# Patient Record
Sex: Female | Born: 1985 | Race: White | Hispanic: No | Smoking: Current every day smoker
Health system: Southern US, Community
[De-identification: ages and names within clinical notes are randomized; demographics above are authoritative.]

## PROBLEM LIST (undated history)

## (undated) DIAGNOSIS — R87629 Unspecified abnormal cytological findings in specimens from vagina: Secondary | ICD-10-CM

## (undated) HISTORY — PX: CRYOTHERAPY: SHX1416

## (undated) HISTORY — PX: WISDOM TOOTH EXTRACTION: SHX21

## (undated) HISTORY — DX: Unspecified abnormal cytological findings in specimens from vagina: R87.629

## (undated) HISTORY — PX: APPENDECTOMY: SHX54

---
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
## 2003-02-18 ENCOUNTER — Emergency Department (HOSPITAL_COMMUNITY): Admission: EM | Admit: 2003-02-18 | Discharge: 2003-02-18 | Payer: Self-pay | Admitting: Emergency Medicine

## 2003-04-28 ENCOUNTER — Emergency Department (HOSPITAL_COMMUNITY): Admission: EM | Admit: 2003-04-28 | Discharge: 2003-04-28 | Payer: Self-pay

## 2004-01-11 ENCOUNTER — Ambulatory Visit (HOSPITAL_COMMUNITY): Admission: RE | Admit: 2004-01-11 | Discharge: 2004-01-11 | Payer: Self-pay | Admitting: Obstetrics & Gynecology

## 2004-01-14 ENCOUNTER — Emergency Department (HOSPITAL_COMMUNITY): Admission: EM | Admit: 2004-01-14 | Discharge: 2004-01-14 | Payer: Self-pay | Admitting: Emergency Medicine

## 2005-10-08 ENCOUNTER — Emergency Department (HOSPITAL_COMMUNITY): Admission: EM | Admit: 2005-10-08 | Discharge: 2005-10-08 | Payer: Self-pay | Admitting: Emergency Medicine

## 2006-12-23 ENCOUNTER — Emergency Department (HOSPITAL_COMMUNITY): Admission: EM | Admit: 2006-12-23 | Discharge: 2006-12-23 | Payer: Self-pay | Admitting: Emergency Medicine

## 2008-04-27 ENCOUNTER — Inpatient Hospital Stay (HOSPITAL_COMMUNITY): Admission: AD | Admit: 2008-04-27 | Discharge: 2008-04-29 | Payer: Self-pay | Admitting: Obstetrics & Gynecology

## 2009-03-20 ENCOUNTER — Emergency Department (HOSPITAL_COMMUNITY): Admission: EM | Admit: 2009-03-20 | Discharge: 2009-03-20 | Payer: Self-pay | Admitting: Emergency Medicine

## 2009-09-17 ENCOUNTER — Emergency Department (HOSPITAL_COMMUNITY): Admission: EM | Admit: 2009-09-17 | Discharge: 2009-09-17 | Payer: Self-pay | Admitting: Emergency Medicine

## 2010-05-27 LAB — DIFFERENTIAL
Eosinophils Relative: 0 % (ref 0–5)
Lymphocytes Relative: 4 % — ABNORMAL LOW (ref 12–46)
Lymphs Abs: 0.6 10*3/uL — ABNORMAL LOW (ref 0.7–4.0)
Monocytes Absolute: 0.2 10*3/uL (ref 0.1–1.0)
Neutro Abs: 13 10*3/uL — ABNORMAL HIGH (ref 1.7–7.7)
Neutrophils Relative %: 94 % — ABNORMAL HIGH (ref 43–77)

## 2010-05-27 LAB — URINE MICROSCOPIC-ADD ON

## 2010-05-27 LAB — COMPREHENSIVE METABOLIC PANEL
Alkaline Phosphatase: 120 U/L — ABNORMAL HIGH (ref 39–117)
BUN: 14 mg/dL (ref 6–23)
Chloride: 110 mEq/L (ref 96–112)
GFR calc Af Amer: >60 mL/min (ref >60)
GFR calc non Af Amer: >60 mL/min (ref >60)
Potassium: 4.6 mEq/L (ref 3.5–5.1)

## 2010-05-27 LAB — URINALYSIS, ROUTINE W REFLEX MICROSCOPIC
Ketones, ur: >80 mg/dL — AB
Protein, ur: NEGATIVE mg/dL
Urobilinogen, UA: 0.2 mg/dL (ref 0.0–1.0)

## 2010-05-27 LAB — CBC
HCT: 48.2 % — ABNORMAL HIGH (ref 36.0–46.0)
Hemoglobin: 16.3 g/dL — ABNORMAL HIGH (ref 12.0–15.0)
MCHC: 33.8 g/dL (ref 30.0–36.0)
Platelets: 205 10*3/uL (ref 150–400)
RDW: 12.7 % (ref 11.5–15.5)

## 2010-06-26 LAB — CBC
HCT: 31.9 % — ABNORMAL LOW (ref 36.0–46.0)
HCT: 38.2 % (ref 36.0–46.0)
Hemoglobin: 10.9 g/dL — ABNORMAL LOW (ref 12.0–15.0)
MCHC: 34.2 g/dL (ref 30.0–36.0)
MCV: 94.6 fL (ref 78.0–100.0)
MCV: 95.3 fL (ref 78.0–100.0)
Platelets: 120 10*3/uL — ABNORMAL LOW (ref 150–400)
RDW: 13.4 % (ref 11.5–15.5)
RDW: 13.5 % (ref 11.5–15.5)
WBC: 20 10*3/uL — ABNORMAL HIGH (ref 4.0–10.5)

## 2010-07-13 ENCOUNTER — Emergency Department (HOSPITAL_COMMUNITY): Payer: Medicaid Other

## 2010-07-13 ENCOUNTER — Emergency Department (HOSPITAL_COMMUNITY)
Admission: EM | Admit: 2010-07-13 | Discharge: 2010-07-13 | Disposition: A | Discharge status: Home / Self Care | Payer: Medicaid Other | Attending: Emergency Medicine | Admitting: Emergency Medicine

## 2010-07-13 DIAGNOSIS — W268XXA Contact with other sharp object(s), not elsewhere classified, initial encounter: Secondary | ICD-10-CM | POA: Insufficient documentation

## 2010-07-13 DIAGNOSIS — S9780XA Crushing injury of unspecified foot, initial encounter: Secondary | ICD-10-CM | POA: Insufficient documentation

## 2010-07-13 DIAGNOSIS — S91309A Unspecified open wound, unspecified foot, initial encounter: Secondary | ICD-10-CM | POA: Insufficient documentation

## 2010-07-13 DIAGNOSIS — W64XXXA Exposure to other animate mechanical forces, initial encounter: Secondary | ICD-10-CM | POA: Insufficient documentation

## 2010-07-27 NOTE — Op Note (Signed)
[place]  Patient:    Tracy Barrera, Tracy Barrera                       MRN: [id number] Proc. Date: 08/03/99 Adm. Date:  [id number] Disc. Date: [id number] Attending:  Katha Cabal                           Operative Report  PREOPERATIVE DIAGNOSIS:  POSTOPERATIVE DIAGNOSIS:  OPERATION PERFORMED:  SURGEON:  Molli Hazard B. Daphine Deutscher, M.D.  OPERATIVE FINDINGS:  Early acute retrocecal appendicitis.  ANESTHESIA:  General endotracheal.  INDICATIONS FOR PROCEDURE:  The patient is a 26 year old young lady with a 24-hour history of right lower quadrant abdominal pain.  According to her mother, she has had a pain like this in the distant past.  CT scan with rectal contrast was obtained in the emergency room which was read by Arnell Sieving, M.D. as demonstrating retrocecal appendix with early appendicitis. Informed consent was obtained from the mother and daughter in the holding area and options were fully discussed including observation with antibiotics as well as open surgery.  They were aware of the risks of the procedure and indications.  DESCRIPTION OF PROCEDURE:  The patient was taken to operating room 16 and given general anesthesia.  The abdomen was prepped with Betadine and draped sterilely.  Preoperatively, she had received three grams of Unasyn.  I made a small transverse incision lateral to McBurneys point and used a muscle splitting approach and tried to minimize the size of the incision.  The abdomen was entered without difficulty.  I palpated into her pelvis and did not feel any inflammatory masses around her ovary.  The terminal ileum was seen and the appendix was found by following the tinea and this was very stuck down into the pelvis and was quite long.  I ended up finding the tip and then working my way backward using clips as well as ties to ligate the mesentery and finally isolate the base.  I double tied the base with #1 chromic and  then amputated it at the base and then cauterized the mucosa.  The appendix was removed thusly.  The area was irrigated.  No bleeding was seen.  Then the peritoneum was closed with 2-0 Vicryl.  The fascia was closed with a running 2-0 Vicryl and the wound was closed with 4-0 Vicryl.  Prior to closure, I injected the area with 0.5% Marcaine 10 cc and closed the skin with 4-0 Vicryl subcuticularly with benzoin and Steri-Strips.  The patient seemed to tolerate the procedure well and was taken to the recovery room in satisfactory condition. DD:  08/03/99 TD:  08/08/99 Job: [id number]

## 2016-06-19 ENCOUNTER — Other Ambulatory Visit: Payer: Self-pay | Admitting: Obstetrics & Gynecology

## 2016-06-20 LAB — CYTOLOGY - PAP

## 2016-06-29 ENCOUNTER — Encounter (HOSPITAL_COMMUNITY): Payer: Self-pay | Admitting: *Deleted

## 2016-06-29 ENCOUNTER — Emergency Department (HOSPITAL_COMMUNITY): Payer: Medicaid Other

## 2016-06-29 ENCOUNTER — Emergency Department (HOSPITAL_COMMUNITY)
Admission: EM | Admit: 2016-06-29 | Discharge: 2016-06-30 | Disposition: A | Discharge status: Home / Self Care | Payer: Medicaid Other | Attending: Emergency Medicine | Admitting: Emergency Medicine

## 2016-06-29 DIAGNOSIS — S022XXA Fracture of nasal bones, initial encounter for closed fracture: Secondary | ICD-10-CM | POA: Diagnosis not present

## 2016-06-29 DIAGNOSIS — F17200 Nicotine dependence, unspecified, uncomplicated: Secondary | ICD-10-CM | POA: Diagnosis not present

## 2016-06-29 DIAGNOSIS — S20212A Contusion of left front wall of thorax, initial encounter: Secondary | ICD-10-CM | POA: Insufficient documentation

## 2016-06-29 DIAGNOSIS — Y999 Unspecified external cause status: Secondary | ICD-10-CM | POA: Insufficient documentation

## 2016-06-29 DIAGNOSIS — S0990XA Unspecified injury of head, initial encounter: Secondary | ICD-10-CM | POA: Diagnosis present

## 2016-06-29 DIAGNOSIS — R935 Abnormal findings on diagnostic imaging of other abdominal regions, including retroperitoneum: Secondary | ICD-10-CM | POA: Insufficient documentation

## 2016-06-29 DIAGNOSIS — W5512XA Struck by horse, initial encounter: Secondary | ICD-10-CM | POA: Insufficient documentation

## 2016-06-29 DIAGNOSIS — Y939 Activity, unspecified: Secondary | ICD-10-CM | POA: Diagnosis not present

## 2016-06-29 DIAGNOSIS — Y929 Unspecified place or not applicable: Secondary | ICD-10-CM | POA: Insufficient documentation

## 2016-06-29 DIAGNOSIS — M25512 Pain in left shoulder: Secondary | ICD-10-CM | POA: Insufficient documentation

## 2016-06-29 DIAGNOSIS — R079 Chest pain, unspecified: Secondary | ICD-10-CM | POA: Diagnosis not present

## 2016-06-29 DIAGNOSIS — M542 Cervicalgia: Secondary | ICD-10-CM | POA: Insufficient documentation

## 2016-06-29 LAB — COMPREHENSIVE METABOLIC PANEL
ALBUMIN: 4.1 g/dL (ref 3.5–5.0)
ALT: 9 U/L — AB (ref 14–54)
AST: 20 U/L (ref 15–41)
Alkaline Phosphatase: 75 U/L (ref 38–126)
Anion gap: 10 (ref 5–15)
BILIRUBIN TOTAL: 0.4 mg/dL (ref 0.3–1.2)
BUN: 6 mg/dL (ref 6–20)
CHLORIDE: 111 mmol/L (ref 101–111)
CO2: 18 mmol/L — ABNORMAL LOW (ref 22–32)
CREATININE: 0.85 mg/dL (ref 0.44–1.00)
Calcium: 9.3 mg/dL (ref 8.9–10.3)
GFR calc Af Amer: >60 mL/min (ref >60)
GLUCOSE: 97 mg/dL (ref 65–99)
Potassium: 3.3 mmol/L — ABNORMAL LOW (ref 3.5–5.1)
Sodium: 139 mmol/L (ref 135–145)
Total Protein: 6.6 g/dL (ref 6.5–8.1)

## 2016-06-29 LAB — CBC
HEMATOCRIT: 39.8 % (ref 36.0–46.0)
Hemoglobin: 13.9 g/dL (ref 12.0–15.0)
MCH: 31.4 pg (ref 26.0–34.0)
MCHC: 34.9 g/dL (ref 30.0–36.0)
MCV: 89.8 fL (ref 78.0–100.0)
PLATELETS: 223 10*3/uL (ref 150–400)
RBC: 4.43 MIL/uL (ref 3.87–5.11)
RDW: 12.2 % (ref 11.5–15.5)
WBC: 13.8 10*3/uL — AB (ref 4.0–10.5)

## 2016-06-29 LAB — I-STAT CHEM 8, ED
BUN: 7 mg/dL (ref 6–20)
Calcium, Ion: 1.01 mmol/L — ABNORMAL LOW (ref 1.15–1.40)
Chloride: 111 mmol/L (ref 101–111)
Creatinine, Ser: 0.7 mg/dL (ref 0.44–1.00)
Glucose, Bld: 98 mg/dL (ref 65–99)
HEMATOCRIT: 40 % (ref 36.0–46.0)
HEMOGLOBIN: 13.6 g/dL (ref 12.0–15.0)
POTASSIUM: 3.5 mmol/L (ref 3.5–5.1)
SODIUM: 140 mmol/L (ref 135–145)
TCO2: 21 mmol/L (ref 0–100)

## 2016-06-29 LAB — URINALYSIS, ROUTINE W REFLEX MICROSCOPIC
BACTERIA UA: NONE SEEN
Bilirubin Urine: NEGATIVE
GLUCOSE, UA: NEGATIVE mg/dL
KETONES UR: 5 mg/dL — AB
Leukocytes, UA: NEGATIVE
Nitrite: NEGATIVE
PROTEIN: NEGATIVE mg/dL
Specific Gravity, Urine: 1.003 — ABNORMAL LOW (ref 1.005–1.030)
pH: 8 (ref 5.0–8.0)

## 2016-06-29 LAB — ETHANOL: Alcohol, Ethyl (B): <5 mg/dL (ref <5)

## 2016-06-29 LAB — PROTIME-INR
INR: 1.04
Prothrombin Time: 13.6 seconds (ref 11.4–15.2)

## 2016-06-29 LAB — CDS SEROLOGY

## 2016-06-29 LAB — I-STAT CG4 LACTIC ACID, ED: LACTIC ACID, VENOUS: 1.8 mmol/L (ref 0.5–1.9)

## 2016-06-29 LAB — I-STAT BETA HCG BLOOD, ED (MC, WL, AP ONLY): I-stat hCG, quantitative: <5 m[IU]/mL (ref <5)

## 2016-06-29 MED ORDER — SODIUM CHLORIDE 0.9 % IV SOLN: INTRAVENOUS | Status: DC

## 2016-06-29 MED ORDER — SODIUM CHLORIDE 0.9 % IV BOLUS (SEPSIS)
1000.0000 mL | Freq: Once | INTRAVENOUS | Status: AC
  Administered 2016-06-29: 1000 mL via INTRAVENOUS

## 2016-06-29 MED ORDER — ACETAMINOPHEN 500 MG PO TABS: 1000.0000 mg | ORAL_TABLET | Freq: Once | ORAL | Status: DC

## 2016-06-29 MED ORDER — IOPAMIDOL (ISOVUE-300) INJECTION 61%
INTRAVENOUS | Status: AC
  Administered 2016-06-29: 100 mL
  Filled 2016-06-29: qty 100

## 2016-06-29 MED ORDER — FENTANYL CITRATE (PF) 100 MCG/2ML IJ SOLN
50.0000 ug | INTRAMUSCULAR | Status: DC | PRN
  Administered 2016-06-29: 50 ug via INTRAVENOUS
  Filled 2016-06-29: qty 2

## 2016-06-29 NOTE — Progress Notes (Signed)
   06/29/16 2100  Clinical Encounter Type  Visited With Patient;Health care provider  Visit Type Trauma  Referral From Nurse  Consult/Referral To Chaplain  Spiritual Encounters  Spiritual Needs Emotional  Stress Factors  Patient Stress Factors Health changes    Chaplain paged to level 2 trauma in ED. Patient stepped on by horse. Provided emotional support and ministry of presence. Rejeana Fadness L. Salomon Fick, MDiv

## 2016-06-29 NOTE — ED Provider Notes (Signed)
[facility]-EMERGENCY DEPT Provider Note   CSN: [id number] Arrival date & time: 06/29/16  2116     History   Chief Complaint Chief Complaint  Patient presents with  . Fall    HPI Tracy Barrera is a 31 y.o. female.  Patient presents by EMS after a mule dragged her several feet.  Patient has significant left upper chest and shoulder pain. Patient did have head injury with brief loss of consciousness. No blood thinners no significant medical history. Patient has significant pain and was given fentanyl on route currently does not want further medication. Pain with any range of motion.      History reviewed. No pertinent past medical history.  There are no active problems to display for this patient.   History reviewed. No pertinent surgical history.  OB History    No data available       Home Medications    Prior to Admission medications   Not on File    Family History No family history on file.  Social History Social History  Substance Use Topics  . Smoking status: Current Every Day Smoker  . Smokeless tobacco: Never Used  . Alcohol use No     Allergies   Aspirin   Review of Systems Review of Systems  Constitutional: Negative for chills and fever.  HENT: Negative for congestion.   Eyes: Negative for visual disturbance.  Respiratory: Negative for shortness of breath.   Cardiovascular: Negative for chest pain.  Gastrointestinal: Negative for abdominal pain and vomiting.  Genitourinary: Negative for dysuria and flank pain.  Musculoskeletal: Positive for arthralgias, back pain and neck pain. Negative for neck stiffness.  Skin: Negative for rash.  Neurological: Positive for syncope and headaches. Negative for light-headedness.     Physical Exam Updated Vital Signs BP 119/62   Pulse 64   Resp 18   Ht  (1.702 m)   Wt 160 lb (72.6 kg)   LMP 05/29/2016   SpO2 98%   BMI 25.06 kg/m   Physical Exam  Constitutional: She appears well-developed and  well-nourished. No distress.  HENT:  Head: Normocephalic.  C-collar in place  Eyes: EOM are normal. Right eye exhibits no discharge. Left eye exhibits no discharge.  Neck: Neck supple.  Cardiovascular: Tachycardia present.   Pulmonary/Chest: Effort normal. No respiratory distress.  Abdominal: Soft. There is no tenderness.  Musculoskeletal: She exhibits edema, tenderness and deformity.  Patient has significant tenderness left upper chest with ecchymosis, superficial abrasions and mild swelling. Patient has tenderness left shoulder left arm difficult exam due to pain, neurovascular intact.  No midline cervical thoracic or lumbar tenderness. No tenderness or effusion of hips knees or ankles bilateral, elbows or wrists bilateral.   Neurological: She is alert. No cranial nerve deficit.  Skin: Skin is warm. Rash noted.  Psychiatric:  In pain, anxious  Nursing note and vitals reviewed.    ED Treatments / Results  Labs (all labs ordered are listed, but only abnormal results are displayed) Labs Reviewed  COMPREHENSIVE METABOLIC PANEL - Abnormal; Notable for the following:       Result Value   Potassium 3.3 (*)    CO2 18 (*)    ALT 9 (*)    All other components within normal limits  CBC - Abnormal; Notable for the following:    WBC 13.8 (*)    All other components within normal limits  URINALYSIS, ROUTINE W REFLEX MICROSCOPIC - Abnormal; Notable for the following:    Color, Urine STRAW (*)  Specific Gravity, Urine 1.003 (*)    Hgb urine dipstick SMALL (*)    Ketones, ur 5 (*)    Squamous Epithelial / LPF 0-5 (*)    All other components within normal limits  I-STAT CHEM 8, ED - Abnormal; Notable for the following:    Calcium, Ion 1.01 (*)    All other components within normal limits  CDS SEROLOGY  ETHANOL  PROTIME-INR  I-STAT CG4 LACTIC ACID, ED  I-STAT BETA HCG BLOOD, ED ([facility], [facility], [facility] ONLY)  SAMPLE TO BLOOD BANK    EKG  EKG Interpretation  Date/Time:  Saturday June 29 2016 21:38:08 EDT Ventricular Rate:  62 PR Interval:    QRS Duration: 102 QT Interval:  455 QTC Calculation: 463 R Axis:   44 Text Interpretation:  Sinus rhythm Confirmed by Jodi Mourning MD, Youa Deloney [phone]) on 06/29/2016 10:13:24 PM       Radiology Ct Head Wo Contrast  Result Date: 06/29/2016 CLINICAL DATA:  Kink and interactive by a [place].  Headache. EXAM: CT HEAD WITHOUT CONTRAST CT MAXILLOFACIAL WITHOUT CONTRAST CT CERVICAL SPINE WITHOUT CONTRAST TECHNIQUE: Multidetector CT imaging of the head, cervical spine, and maxillofacial structures were performed using the standard protocol without intravenous contrast. Multiplanar CT image reconstructions of the cervical spine and maxillofacial structures were also generated. COMPARISON:  None. FINDINGS: CT HEAD FINDINGS Brain: No evidence of acute infarction, hemorrhage, hydrocephalus, extra-axial collection or mass lesion/mass effect. Vascular: No hyperdense vessel or unexpected calcification. Skull: Normal. Negative for fracture or focal lesion. Other: None. CT MAXILLOFACIAL FINDINGS Osseous: Fracture of the tip of the nasal bone without depression. Tiny fracture of the tip of the anterior maxillary spine. The remainder of the facial bones are intact. Orbits: Negative. No traumatic or inflammatory finding. Sinuses: Mild right frontal, right ethmoid and right maxillary sinus mucosal thickening. Soft tissues: Unremarkable. CT CERVICAL SPINE FINDINGS Alignment: Normal. Skull base and vertebrae: No acute fracture. No primary bone lesion or focal pathologic process. Soft tissues and spinal canal: No prevertebral fluid or swelling. No visible canal hematoma. Disc levels:  Minimal anterior spur formation at the C3-4 level. Upper chest: Clear lung apices. Other: None. IMPRESSION: 1. Nondepressed fracture of the distal tip of the nasal bone. 2. Tiny fracture of the tip of the anterior maxillary spine. 3. Mild chronic right frontal, ethmoid and maxillary sinusitis. 4. No  skull fracture or intracranial hemorrhage. 5. No cervical spine fracture or subluxation. Electronically Signed   By: Beckie Salts M.D.   On: 06/29/2016 23:14   Ct Chest W Contrast  Result Date: 06/29/2016 CLINICAL DATA:  Initial evaluation for acute trauma, kicked in track by horse. Acute left shoulder pain and back pain. EXAM: CT CHEST, ABDOMEN, AND PELVIS WITH CONTRAST TECHNIQUE: Multidetector CT imaging of the chest, abdomen and pelvis was performed following the standard protocol during bolus administration of intravenous contrast. CONTRAST:  ISOVUE-300 IOPAMIDOL (ISOVUE-300) INJECTION 61% COMPARISON:  Prior pelvic radiograph from earlier same day. FINDINGS: CT CHEST FINDINGS Cardiovascular: Intrathoracic aorta of normal caliber and appearance without acute injury. Visualized great vessels within normal limits. Heart size normal. No pericardial effusion. Limited evaluation of the pulmonary arteries grossly unremarkable. Mediastinum/Nodes: Visualized thyroid is normal. No pathologically enlarged mediastinal, hilar, or axillary adenopathy. No mediastinal hematoma. Esophagus is normal. Lungs/Pleura: Tracheobronchial tree is widely patent. Mild subsegmental atelectasis seen dependently within the lower lobes bilaterally. Lungs are otherwise clear. No focal infiltrates. No evidence for pulmonary contusion. No pulmonary edema or pleural effusion. No pneumothorax. No worrisome pulmonary  nodule or mass. Musculoskeletal: Soft tissue contusion present anterior to the left shoulder (series 201, image 10). External soft tissues otherwise unremarkable. No acute fracture. No worrisome lytic or blastic osseous lesions. CT ABDOMEN PELVIS FINDINGS Hepatobiliary: Liver demonstrates a normal contrast enhanced appearance. Gallbladder normal. No biliary dilatation. Pancreas: Pancreas within normal limits. Spleen: Spleen within normal limits. Adrenals/Urinary Tract: Adrenal glands are normal. Kidneys equal in size with  symmetric enhancement. No nephrolithiasis, hydronephrosis or focal enhancing renal mass. No hydroureter. Bladder within normal limits. Stomach/Bowel: Stomach within normal limits. No evidence for bowel obstruction or acute bowel injury. Appendix surgically absent. No acute inflammatory changes seen about the bowels. Vascular/Lymphatic: Normal intravascular enhancement seen throughout the intra-abdominal aorta and its branch vessels. No adenopathy. Reproductive: Uterus and ovaries within normal limits for age. Other: No free air. Trace free fluid within the pelvis, suspected to be physiologic in nature. No mesenteric or retroperitoneal hematoma. Small fat containing paraumbilical hernia noted. Musculoskeletal: External soft tissues demonstrate no acute abnormality. No acute osseus abnormality. No worrisome lytic or blastic osseous lesions. IMPRESSION: 1. Soft tissue contusion anterior to the left shoulder as above. 2. No other acute traumatic injury within the chest, abdomen, and pelvis. Electronically Signed   By: Rise Mu M.D.   On: 06/29/2016 23:36   Ct Cervical Spine Wo Contrast  Result Date: 06/29/2016 CLINICAL DATA:  Kink and interactive by a [place].  Headache. EXAM: CT HEAD WITHOUT CONTRAST CT MAXILLOFACIAL WITHOUT CONTRAST CT CERVICAL SPINE WITHOUT CONTRAST TECHNIQUE: Multidetector CT imaging of the head, cervical spine, and maxillofacial structures were performed using the standard protocol without intravenous contrast. Multiplanar CT image reconstructions of the cervical spine and maxillofacial structures were also generated. COMPARISON:  None. FINDINGS: CT HEAD FINDINGS Brain: No evidence of acute infarction, hemorrhage, hydrocephalus, extra-axial collection or mass lesion/mass effect. Vascular: No hyperdense vessel or unexpected calcification. Skull: Normal. Negative for fracture or focal lesion. Other: None. CT MAXILLOFACIAL FINDINGS Osseous: Fracture of the tip of the nasal bone without  depression. Tiny fracture of the tip of the anterior maxillary spine. The remainder of the facial bones are intact. Orbits: Negative. No traumatic or inflammatory finding. Sinuses: Mild right frontal, right ethmoid and right maxillary sinus mucosal thickening. Soft tissues: Unremarkable. CT CERVICAL SPINE FINDINGS Alignment: Normal. Skull base and vertebrae: No acute fracture. No primary bone lesion or focal pathologic process. Soft tissues and spinal canal: No prevertebral fluid or swelling. No visible canal hematoma. Disc levels:  Minimal anterior spur formation at the C3-4 level. Upper chest: Clear lung apices. Other: None. IMPRESSION: 1. Nondepressed fracture of the distal tip of the nasal bone. 2. Tiny fracture of the tip of the anterior maxillary spine. 3. Mild chronic right frontal, ethmoid and maxillary sinusitis. 4. No skull fracture or intracranial hemorrhage. 5. No cervical spine fracture or subluxation. Electronically Signed   By: Beckie Salts M.D.   On: 06/29/2016 23:14   Ct Abdomen Pelvis W Contrast  Result Date: 06/29/2016 CLINICAL DATA:  Initial evaluation for acute trauma, kicked in track by horse. Acute left shoulder pain and back pain. EXAM: CT CHEST, ABDOMEN, AND PELVIS WITH CONTRAST TECHNIQUE: Multidetector CT imaging of the chest, abdomen and pelvis was performed following the standard protocol during bolus administration of intravenous contrast. CONTRAST:  ISOVUE-300 IOPAMIDOL (ISOVUE-300) INJECTION 61% COMPARISON:  Prior pelvic radiograph from earlier same day. FINDINGS: CT CHEST FINDINGS Cardiovascular: Intrathoracic aorta of normal caliber and appearance without acute injury. Visualized great vessels within normal limits. Heart size normal. No  pericardial effusion. Limited evaluation of the pulmonary arteries grossly unremarkable. Mediastinum/Nodes: Visualized thyroid is normal. No pathologically enlarged mediastinal, hilar, or axillary adenopathy. No mediastinal hematoma.  Esophagus is normal. Lungs/Pleura: Tracheobronchial tree is widely patent. Mild subsegmental atelectasis seen dependently within the lower lobes bilaterally. Lungs are otherwise clear. No focal infiltrates. No evidence for pulmonary contusion. No pulmonary edema or pleural effusion. No pneumothorax. No worrisome pulmonary nodule or mass. Musculoskeletal: Soft tissue contusion present anterior to the left shoulder (series 201, image 10). External soft tissues otherwise unremarkable. No acute fracture. No worrisome lytic or blastic osseous lesions. CT ABDOMEN PELVIS FINDINGS Hepatobiliary: Liver demonstrates a normal contrast enhanced appearance. Gallbladder normal. No biliary dilatation. Pancreas: Pancreas within normal limits. Spleen: Spleen within normal limits. Adrenals/Urinary Tract: Adrenal glands are normal. Kidneys equal in size with symmetric enhancement. No nephrolithiasis, hydronephrosis or focal enhancing renal mass. No hydroureter. Bladder within normal limits. Stomach/Bowel: Stomach within normal limits. No evidence for bowel obstruction or acute bowel injury. Appendix surgically absent. No acute inflammatory changes seen about the bowels. Vascular/Lymphatic: Normal intravascular enhancement seen throughout the intra-abdominal aorta and its branch vessels. No adenopathy. Reproductive: Uterus and ovaries within normal limits for age. Other: No free air. Trace free fluid within the pelvis, suspected to be physiologic in nature. No mesenteric or retroperitoneal hematoma. Small fat containing paraumbilical hernia noted. Musculoskeletal: External soft tissues demonstrate no acute abnormality. No acute osseus abnormality. No worrisome lytic or blastic osseous lesions. IMPRESSION: 1. Soft tissue contusion anterior to the left shoulder as above. 2. No other acute traumatic injury within the chest, abdomen, and pelvis. Electronically Signed   By: Rise Mu M.D.   On: 06/29/2016 23:36   Dg Pelvis  Portable  Result Date: 06/29/2016 CLINICAL DATA:  Pain after being dragged by a mule EXAM: PORTABLE PELVIS 1-2 VIEWS COMPARISON:  None. FINDINGS: There is no evidence of pelvic fracture or diastasis. No pelvic bone lesions are seen. Surgical clips noted over the right pelvis. IMPRESSION: Negative. Electronically Signed   By: Kennith Center M.D.   On: 06/29/2016 22:00   Dg Chest Port 1 View  Result Date: 06/29/2016 CLINICAL DATA:  Chest abrasions and rib pain after being dragging by a [place]. EXAM: PORTABLE CHEST 1 VIEW COMPARISON:  None. FINDINGS: Normal sized heart. Clear lungs. No fracture or pneumothorax seen. No pleural fluid. IMPRESSION: Normal examination. Electronically Signed   By: Beckie Salts M.D.   On: 06/29/2016 21:59   Dg Shoulder Left Portable  Result Date: 06/29/2016 CLINICAL DATA:  Severe left shoulder pain after being dragged by a [place]. EXAM: LEFT SHOULDER - 1 VIEW COMPARISON:  None. FINDINGS: There is no evidence of fracture or dislocation. There is no evidence of arthropathy or other focal bone abnormality. Soft tissues are unremarkable. IMPRESSION: Normal examination. Electronically Signed   By: Beckie Salts M.D.   On: 06/29/2016 22:08   Ct Maxillofacial Wo Cm  Result Date: 06/29/2016 CLINICAL DATA:  Kink and interactive by a [place].  Headache. EXAM: CT HEAD WITHOUT CONTRAST CT MAXILLOFACIAL WITHOUT CONTRAST CT CERVICAL SPINE WITHOUT CONTRAST TECHNIQUE: Multidetector CT imaging of the head, cervical spine, and maxillofacial structures were performed using the standard protocol without intravenous contrast. Multiplanar CT image reconstructions of the cervical spine and maxillofacial structures were also generated. COMPARISON:  None. FINDINGS: CT HEAD FINDINGS Brain: No evidence of acute infarction, hemorrhage, hydrocephalus, extra-axial collection or mass lesion/mass effect. Vascular: No hyperdense vessel or unexpected calcification. Skull: Normal. Negative for fracture or focal lesion.  Other:  None. CT MAXILLOFACIAL FINDINGS Osseous: Fracture of the tip of the nasal bone without depression. Tiny fracture of the tip of the anterior maxillary spine. The remainder of the facial bones are intact. Orbits: Negative. No traumatic or inflammatory finding. Sinuses: Mild right frontal, right ethmoid and right maxillary sinus mucosal thickening. Soft tissues: Unremarkable. CT CERVICAL SPINE FINDINGS Alignment: Normal. Skull base and vertebrae: No acute fracture. No primary bone lesion or focal pathologic process. Soft tissues and spinal canal: No prevertebral fluid or swelling. No visible canal hematoma. Disc levels:  Minimal anterior spur formation at the C3-4 level. Upper chest: Clear lung apices. Other: None. IMPRESSION: 1. Nondepressed fracture of the distal tip of the nasal bone. 2. Tiny fracture of the tip of the anterior maxillary spine. 3. Mild chronic right frontal, ethmoid and maxillary sinusitis. 4. No skull fracture or intracranial hemorrhage. 5. No cervical spine fracture or subluxation. Electronically Signed   By: Beckie Salts M.D.   On: 06/29/2016 23:14    Procedures Procedures (including critical care time)  Medications Ordered in ED Medications  sodium chloride 0.9 % bolus 1,000 mL (1,000 mLs Intravenous New Bag/Given 06/29/16 2156)    And  0.9 %  sodium chloride infusion (not administered)  fentaNYL (SUBLIMAZE) injection 50 mcg (50 mcg Intravenous Given 06/29/16 2214)  acetaminophen (TYLENOL) tablet 1,000 mg (not administered)  iopamidol (ISOVUE-300) 61 % injection (100 mLs  Contrast Given 06/29/16 2237)     Initial Impression / Assessment and Plan / ED Course  I have reviewed the triage vital signs and the nursing notes.  Pertinent labs & imaging results that were available during my care of the patient were reviewed by me and considered in my medical decision making (see chart for details).    Patient presents after significant mechanism of injury with multiple areas of  tenderness and ecchymosis and abrasions. Plan for pain CT scans and multiple x-rays of injured areas.  Fortunately no significant injuries on CT scans. Patient ambulated to the bathroom. Plan for oral fluids and outpatient follow. Patient's mother here to assist.  Results and differential diagnosis were discussed with the patient/parent/guardian. Xrays were independently reviewed by myself.  Close follow up outpatient was discussed, comfortable with the plan.   Medications  sodium chloride 0.9 % bolus 1,000 mL (1,000 mLs Intravenous New Bag/Given 06/29/16 2156)    And  0.9 %  sodium chloride infusion (not administered)  fentaNYL (SUBLIMAZE) injection 50 mcg (50 mcg Intravenous Given 06/29/16 2214)  acetaminophen (TYLENOL) tablet 1,000 mg (not administered)  iopamidol (ISOVUE-300) 61 % injection (100 mLs  Contrast Given 06/29/16 2237)    Vitals:   06/29/16 2145 06/29/16 2147 06/29/16 2206 06/29/16 2257  BP: 111/64  115/67 119/62  Pulse: 70  64   Resp: 18  18   SpO2: 93%  98%   Weight:  160 lb (72.6 kg)    Height:  5\' 7"  (1.702 m)      Final diagnoses:  Struck by horse, initial encounter  Chest wall contusion, left, initial encounter  Acute head injury, initial encounter      Final Clinical Impressions(s) / ED Diagnoses   Final diagnoses:  Struck by horse, initial encounter  Chest wall contusion, left, initial encounter  Acute head injury, initial encounter    New Prescriptions New Prescriptions   No medications on file     Blane Ohara, MD 06/29/16 2353

## 2016-06-29 NOTE — ED Notes (Signed)
Port chest pelvis

## 2016-06-29 NOTE — ED Triage Notes (Signed)
The pt arrived by [facility]  The pt  Had a mule drag her several feet while she was putting the [place] up.Marland Kitchen  She was alos bitten on her  Lt shoulder  She is c/o lt shoulder headache  Iv per ems..  Alert oriented skin warm and dry.  She was given fentanyl  iv by ems  Pain 3-4 on her arrival  lmp last month all month scattered abrasions over her body

## 2016-06-29 NOTE — ED Notes (Signed)
To ct

## 2016-06-29 NOTE — Discharge Instructions (Signed)
Tylenol and ice for pain.   If you were given medicines take as directed.  If you are on coumadin or contraceptives realize their levels and effectiveness is altered by many different medicines.  If you have any reaction (rash, tongues swelling, other) to the medicines stop taking and see a physician.    If your blood pressure was elevated in the ER make sure you follow up for management with a primary doctor or return for chest pain, shortness of breath or stroke symptoms.  Please follow up as directed and return to the ER or see a physician for new or worsening symptoms.  Thank you. Vitals:   06/29/16 2145 06/29/16 2147 06/29/16 2206 06/29/16 2257  BP: 111/64  115/67 119/62  Pulse: 70  64   Resp: 18  18   SpO2: 93%  98%   Weight:  160 lb (72.6 kg)    Height:   (1.702 m)

## 2016-06-30 LAB — SAMPLE TO BLOOD BANK

## 2018-11-08 IMAGING — CR DG SHOULDER 1V*L*
2 series · 2 of 2 positions shown · non-contrast
Comparison: None.

CLINICAL DATA: Severe left shoulder pain after being dragged by [place]
Kendric.

EXAM:
LEFT SHOULDER - 1 VIEW

[AP (1 of 2)]
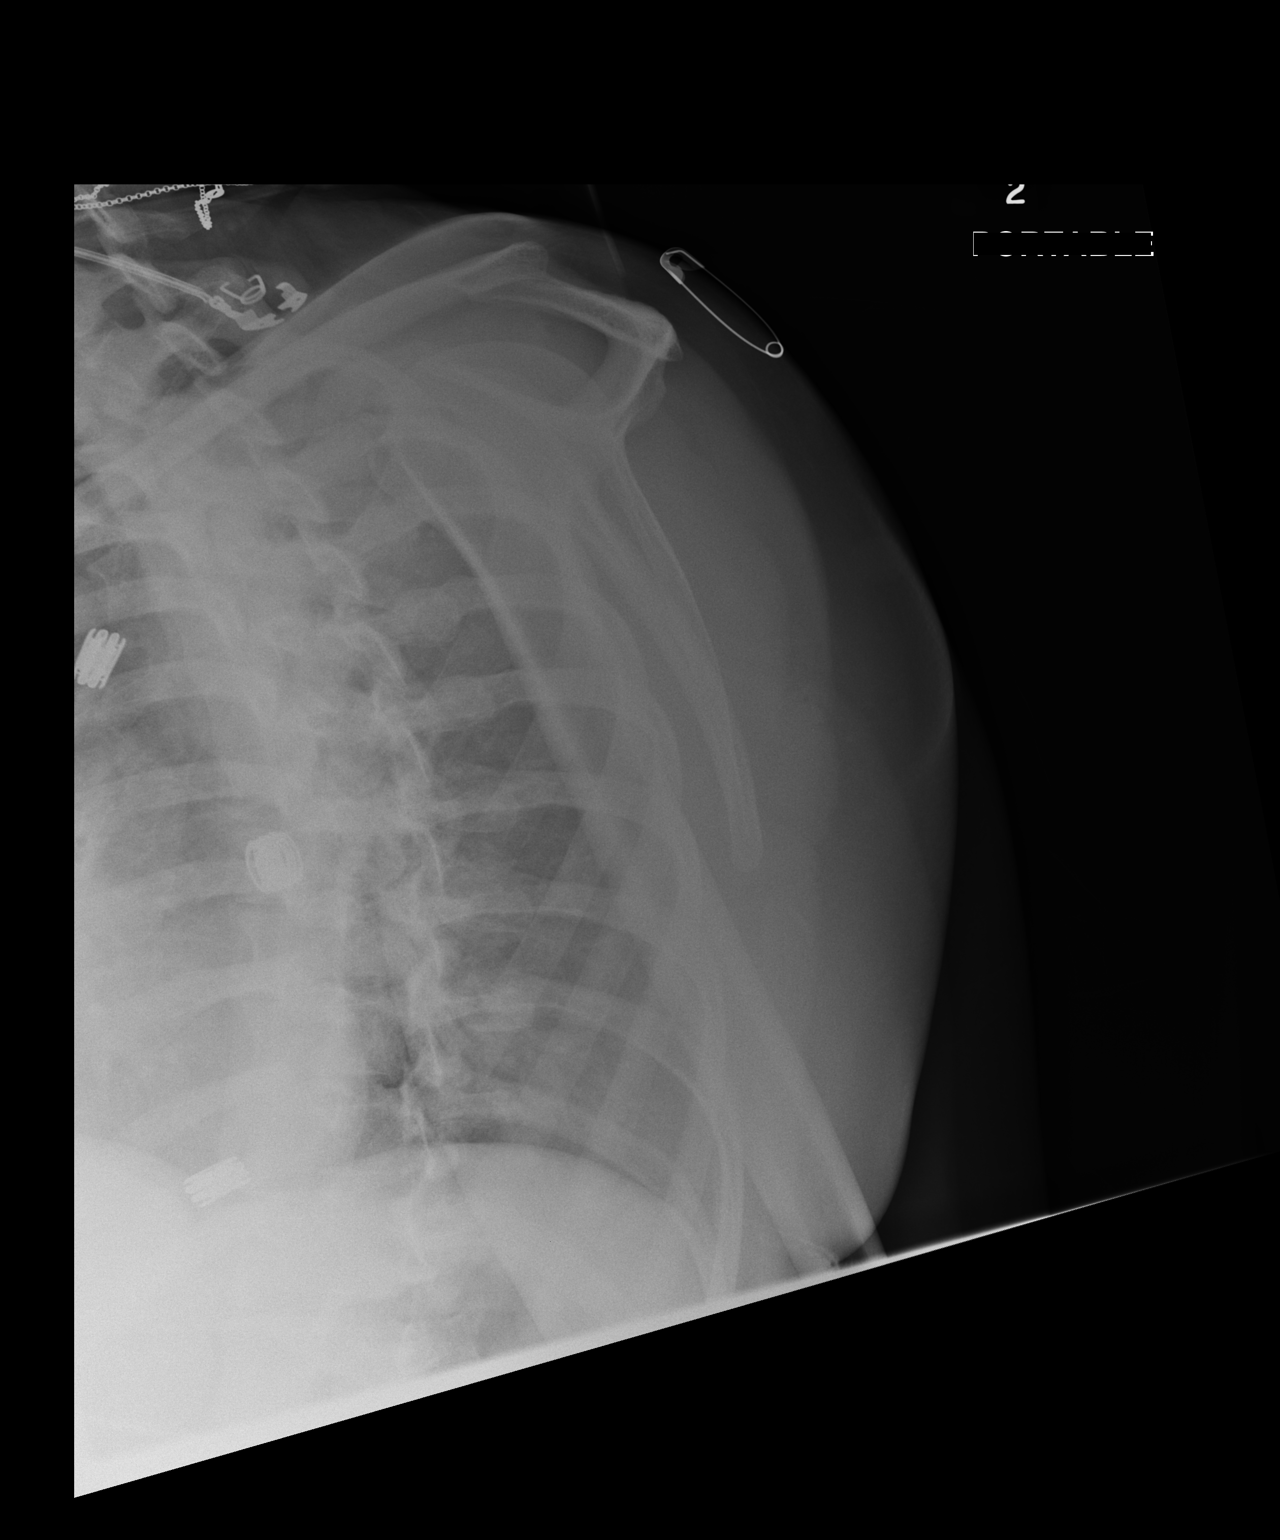

[AP (2 of 2)]
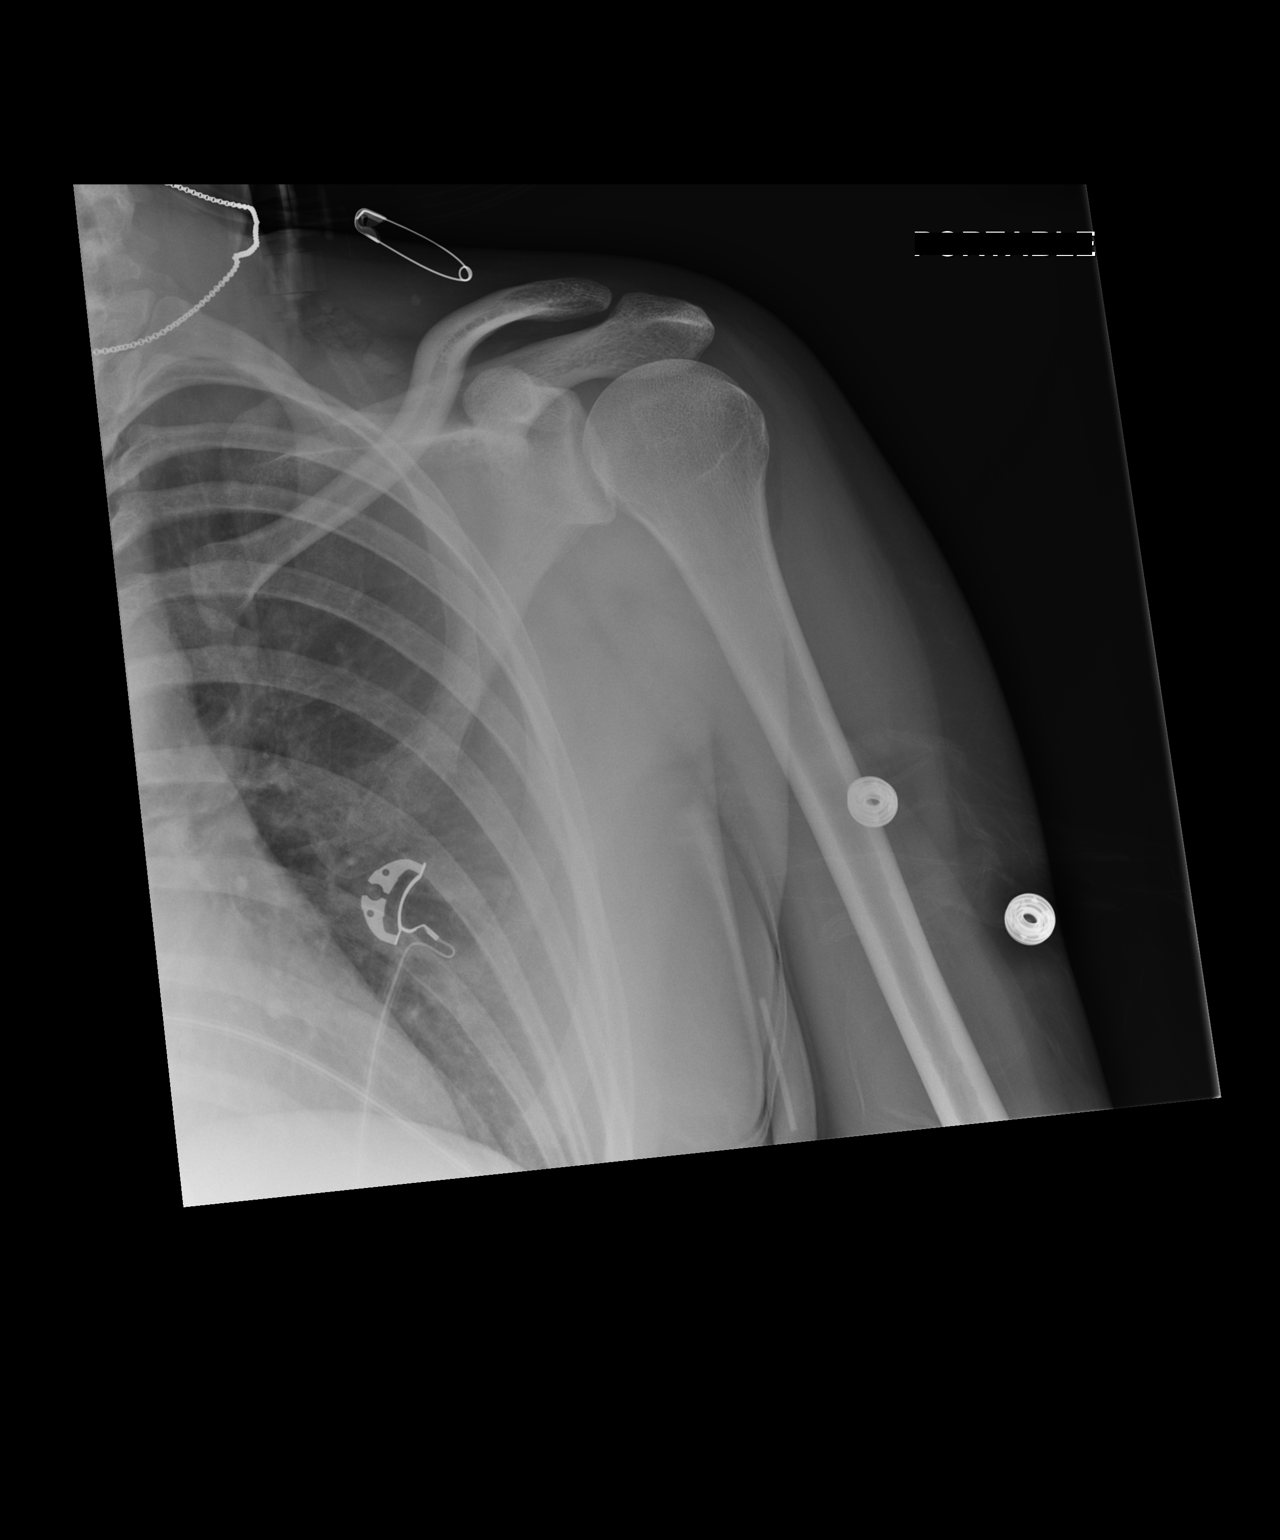

[2 of 2 positions shown; findings below may reference images not displayed]

FINDINGS: There is no evidence of fracture or dislocation. There is no
evidence of arthropathy or other focal bone abnormality. Soft
tissues are unremarkable.
IMPRESSION: Normal examination.

## 2018-11-08 IMAGING — CT CT CHEST W/ CM
1 of 5 series · 12 of 46 positions shown, 14 images · IV contrast (Iodine)
Comparison: Prior pelvic radiograph from earlier same day.

CLINICAL DATA: Initial evaluation for acute trauma, kicked in track
by horse. Acute left shoulder pain and back pain.

EXAM:
CT CHEST, ABDOMEN, AND PELVIS WITH CONTRAST
TECHNIQUE: Multidetector CT imaging of the chest, abdomen and pelvis was
performed following the standard protocol during bolus
administration of intravenous contrast.
CONTRAST:  100mL [id number] IOPAMIDOL ([id number]-[id number]) INJECTION 61%

[Series 201: cap with, idose (2) · axial · 0.98mm/px · z∈[-524,+6]mm · 12 of 126 slices shown, 14 images]
[im 10/126  soft-tissue]
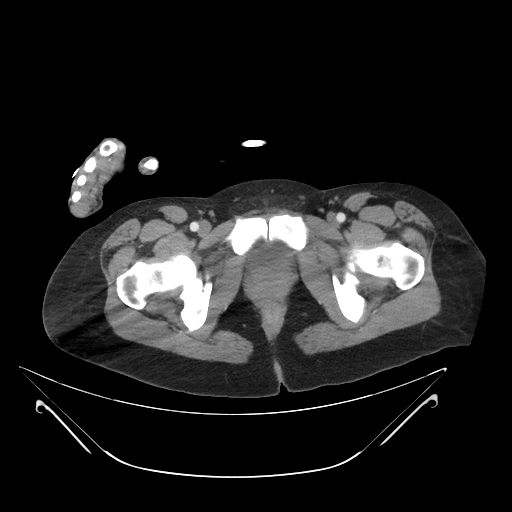
[im 10/126  bone]
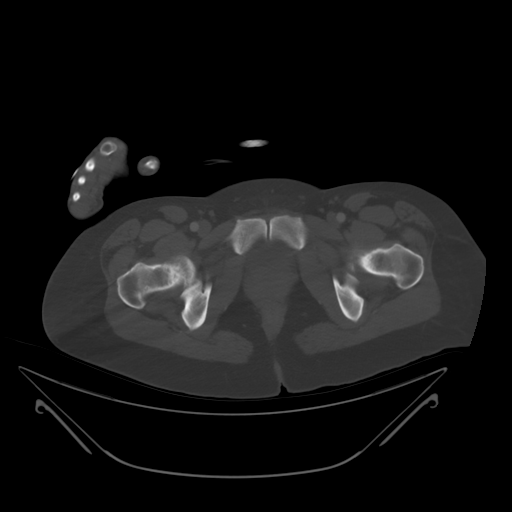
[im 20/126  soft-tissue]
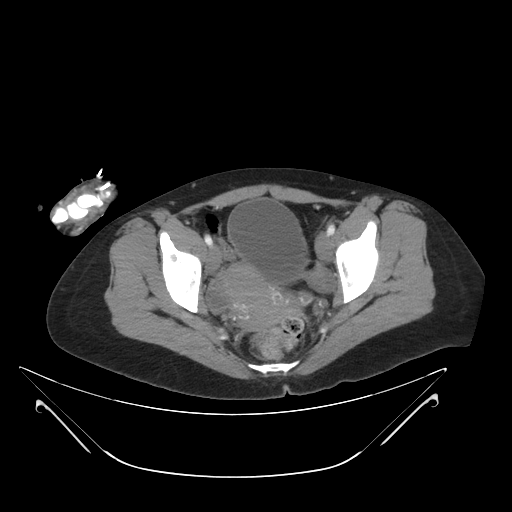
[im 29/126  soft-tissue]
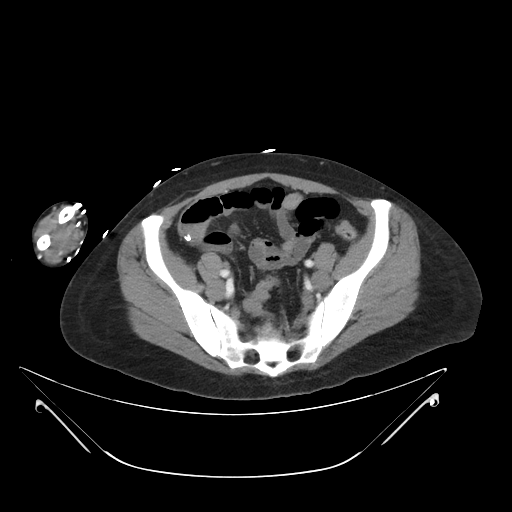
[im 39/126  soft-tissue]
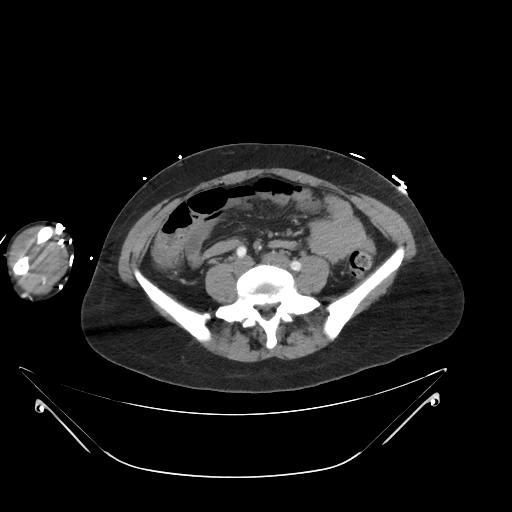
[im 49/126  soft-tissue]
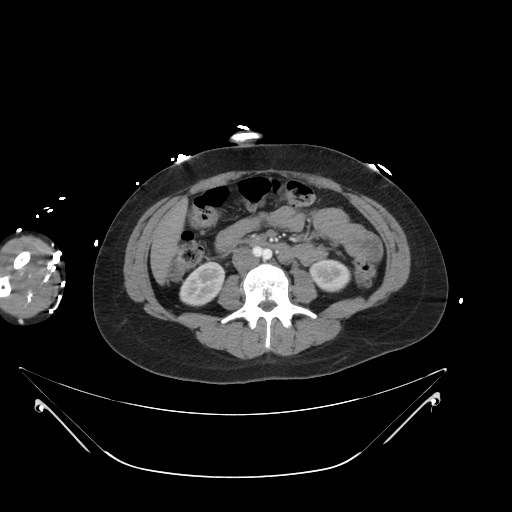
[im 58/126  soft-tissue]
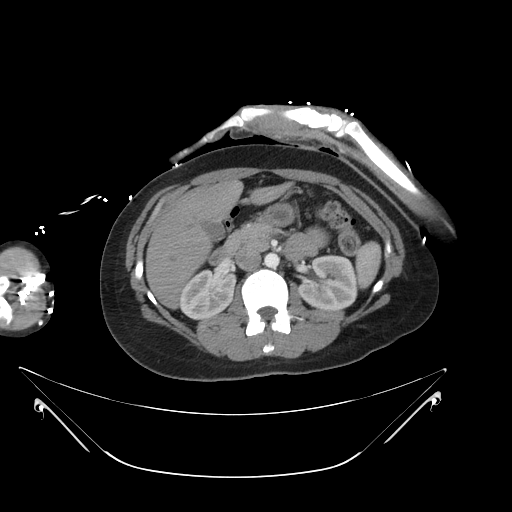
[im 68/126  soft-tissue]
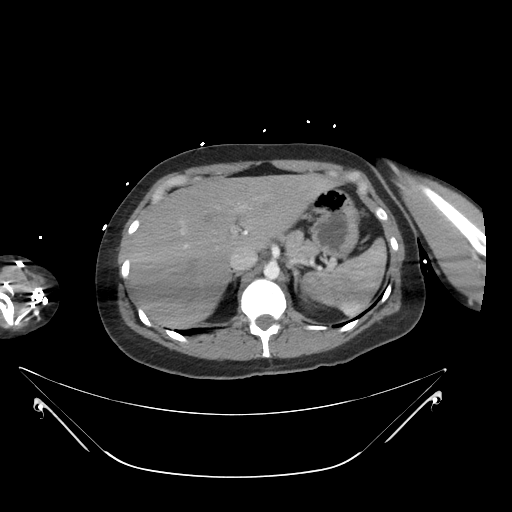
[im 77/126  soft-tissue]
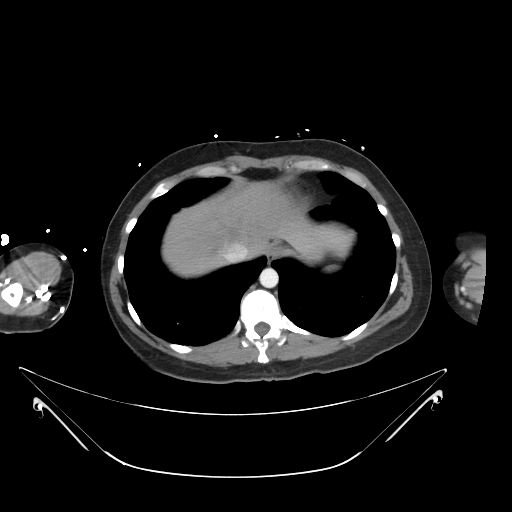
[im 87/126  soft-tissue]
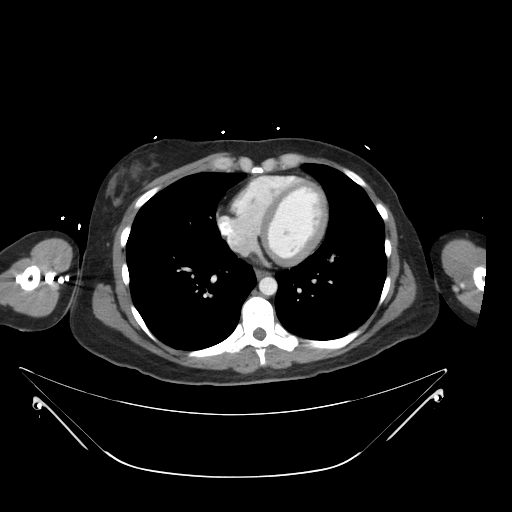
[im 87/126  bone]
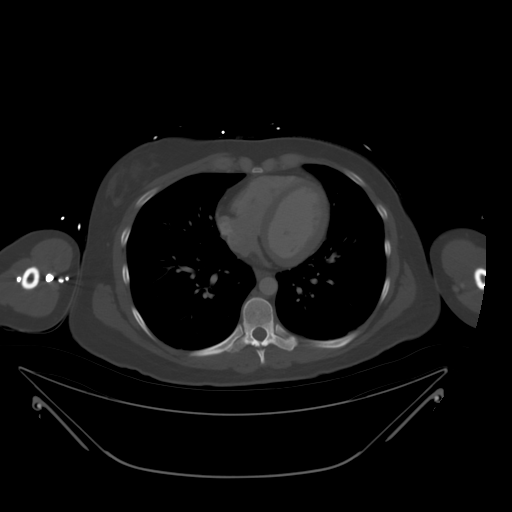
[im 97/126  soft-tissue]
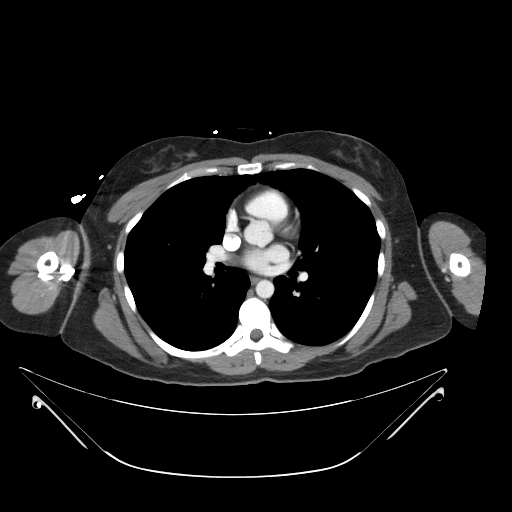
[im 106/126  soft-tissue]
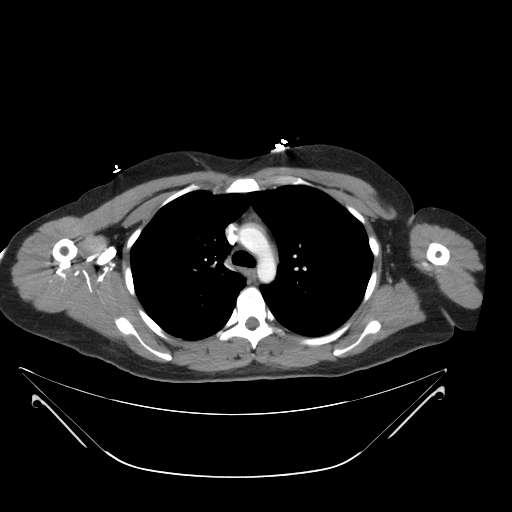
[im 116/126  soft-tissue]
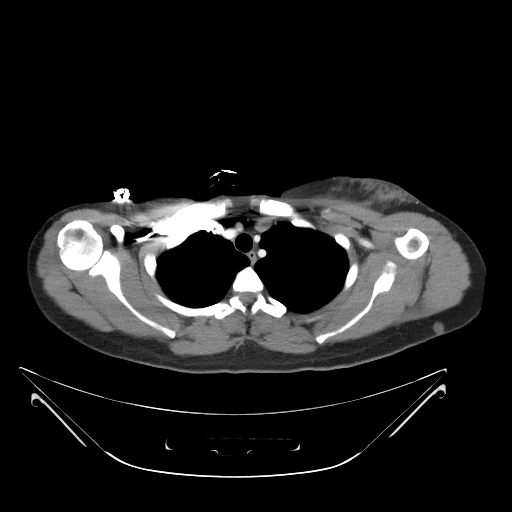

[12 of 46 positions shown; findings below may reference images not displayed]

FINDINGS: CT CHEST FINDINGS

Cardiovascular: Intrathoracic aorta of normal caliber and appearance
without acute injury. Visualized great vessels within normal limits.
Heart size normal. No pericardial effusion. Limited evaluation of
the pulmonary arteries grossly unremarkable.

Mediastinum/Nodes: Visualized thyroid is normal. No pathologically
enlarged mediastinal, hilar, or axillary adenopathy. No mediastinal
hematoma. Esophagus is normal.

Lungs/Pleura: Tracheobronchial tree is widely patent. Mild
subsegmental atelectasis seen dependently within the lower lobes
bilaterally. Lungs are otherwise clear. No focal infiltrates. No
evidence for pulmonary contusion. No pulmonary edema or pleural
effusion. No pneumothorax. No worrisome pulmonary nodule or mass.

Musculoskeletal: Soft tissue contusion present anterior to the left
shoulder (series 201, image 10). External soft tissues otherwise
unremarkable. No acute fracture. No worrisome lytic or blastic
osseous lesions.

CT ABDOMEN PELVIS FINDINGS

Hepatobiliary: Liver demonstrates a normal contrast enhanced
appearance. Gallbladder normal. No biliary dilatation.

Pancreas: Pancreas within normal limits.

Spleen: Spleen within normal limits.

Adrenals/Urinary Tract: Adrenal glands are normal. Kidneys equal in
size with symmetric enhancement. No nephrolithiasis, hydronephrosis
or focal enhancing renal mass. No hydroureter. Bladder within normal
limits.

Stomach/Bowel: Stomach within normal limits. No evidence for bowel
obstruction or acute bowel injury. Appendix surgically absent. No
acute inflammatory changes seen about the bowels.

Vascular/Lymphatic: Normal intravascular enhancement seen throughout
the intra-abdominal aorta and its branch vessels. No adenopathy.

Reproductive: Uterus and ovaries within normal limits for age.

Other: No free air. Trace free fluid within the pelvis, suspected to
be physiologic in nature. No mesenteric or retroperitoneal hematoma.
Small fat containing paraumbilical hernia noted.

Musculoskeletal: External soft tissues demonstrate no acute
abnormality. No acute osseus abnormality. No worrisome lytic or
blastic osseous lesions.
IMPRESSION: 1. Soft tissue contusion anterior to the left shoulder as above.
2. No other acute traumatic injury within the chest, abdomen, and
pelvis.

## 2020-01-12 ENCOUNTER — Ambulatory Visit (INDEPENDENT_AMBULATORY_CARE_PROVIDER_SITE_OTHER): Payer: Medicaid Other | Admitting: Obstetrics and Gynecology

## 2020-01-12 ENCOUNTER — Encounter: Payer: Self-pay | Admitting: Obstetrics and Gynecology

## 2020-01-12 ENCOUNTER — Other Ambulatory Visit (HOSPITAL_COMMUNITY)
Admission: RE | Admit: 2020-01-12 | Discharge: 2020-01-12 | Disposition: A | Discharge status: Home / Self Care | Payer: Medicaid Other | Source: Ambulatory Visit | Attending: Obstetrics and Gynecology | Admitting: Obstetrics and Gynecology

## 2020-01-12 ENCOUNTER — Other Ambulatory Visit: Payer: Self-pay

## 2020-01-12 VITALS — BP 119/71 | Ht 66.25 in | Wt 174.0 lb

## 2020-01-12 DIAGNOSIS — Z30432 Encounter for removal of intrauterine contraceptive device: Secondary | ICD-10-CM | POA: Diagnosis not present

## 2020-01-12 DIAGNOSIS — Z01419 Encounter for gynecological examination (general) (routine) without abnormal findings: Secondary | ICD-10-CM | POA: Insufficient documentation

## 2020-01-12 DIAGNOSIS — Z3009 Encounter for other general counseling and advice on contraception: Secondary | ICD-10-CM

## 2020-01-12 DIAGNOSIS — R102 Pelvic and perineal pain: Secondary | ICD-10-CM | POA: Insufficient documentation

## 2020-01-12 DIAGNOSIS — Z7189 Other specified counseling: Secondary | ICD-10-CM

## 2020-01-12 DIAGNOSIS — Z113 Encounter for screening for infections with a predominantly sexual mode of transmission: Secondary | ICD-10-CM

## 2020-01-12 NOTE — Progress Notes (Signed)
Patient states she has had an IUD for four years. Patient states that it give her pain for 1.5years.   Has tired pills, patch, nexplanon and depo provera. Patient states there were reasons each of these birth control methods didn't work for her.  Armandina Stammer RN

## 2020-01-13 ENCOUNTER — Encounter: Payer: Self-pay | Admitting: Obstetrics and Gynecology

## 2020-01-13 NOTE — Progress Notes (Signed)
   IUD Removal Procedure Note   Patient is 34 y.o. G2P2001 who is here for Paraguard IUD removal. She would like IUD removed secondary to pelvic pain. She has had issues with IUD. She understands that she could get pregnant after removal of IUD if she does not use another form of contraception. She has no other complaints today. Reviewed risks of removal including pain, bleeding, difficult removal and inability to remove IUD which may require hysteroscopic removal in OR. She affirms that she would like IUD removed.  BP 119/71   Ht 5' 6.25" (1.683 m)   Wt 174 lb (78.9 kg)   BMI 27.87 kg/m   Patient with normal appearing external female genitalia. Graves speculum placed in vagina and Paraguard IUD strings easily visualized. Strings grasped with ring forceps and removed easily. Minimal bleeding noted. All instruments removed from vagina. Patient tolerated procedure very well.    She was given post removal instructions. She is planning on using condoms for contraception.   Return for pre-op for BTL    Baldemar Lenis, M.D. Attending Center for [employer] [facility]wife)

## 2020-01-13 NOTE — Progress Notes (Signed)
GYNECOLOGY ANNUAL PREVENTATIVE CARE ENCOUNTER NOTE  Subjective:   Tracy Barrera is a 34 y.o. G44P2001 female here for a annual gynecologic exam. Current complaints: pain with IUD, would like it removed.  Causing issues with intercourse.  Denies abnormal vaginal bleeding, discharge,  or other gynecologic concerns. Accepts STI screen but doesn't want blood work.   Gynecologic History No LMP recorded. (Menstrual status: Other). Contraception: IUD Last Pap: 06/2016. Results: normal Last mammogram: n/a.  Obstetric History OB History  Gravida Para Term Preterm AB Living  2 2 2     1   SAB TAB Ectopic Multiple Live Births          1    # Outcome Date GA Lbr Len/2nd Weight Sex Delivery Anes PTL Lv  2 Term  [redacted]w[redacted]d   M Vag-Spont     1 Term             Past Medical History:  Diagnosis Date  . Vaginal Pap smear, abnormal     Past Surgical History:  Procedure Laterality Date  . APPENDECTOMY    . CRYOTHERAPY    . WISDOM TOOTH EXTRACTION      Current Outpatient Medications on File Prior to Visit  Medication Sig Dispense Refill  . levonorgestrel (MIRENA) 20 MCG/24HR IUD 1 each by Intrauterine route once.     No current facility-administered medications on file prior to visit.    Allergies  Allergen Reactions  . Aspirin Hives and Swelling    Social History   Socioeconomic History  . Marital status: Legally Separated    Spouse name: Not on file  . Number of children: 1  . Years of education: Not on file  . Highest education level: Some college, no degree  Occupational History  . Not on file  Tobacco Use  . Smoking status: Current Every Day Smoker    Packs/day: 1.00    Years: 22.00    Pack years: 22.00    Types: Cigarettes  . Smokeless tobacco: Never Used  Vaping Use  . Vaping Use: Never used  Substance and Sexual Activity  . Alcohol use: No  . Drug use: Yes    Types: Marijuana  . Sexual activity: Yes    Birth control/protection: I.U.D.  Other Topics Concern    . Not on file  Social History Narrative  . Not on file   Social Determinants of Health   Financial Resource Strain:   . Difficulty of Paying Living Expenses: Not on file  Food Insecurity:   . Worried About [redacted]w[redacted]d in the Last Year: Not on file  . Ran Out of Food in the Last Year: Not on file  Transportation Needs:   . Lack of Transportation (Medical): Not on file  . Lack of Transportation (Non-Medical): Not on file  Physical Activity:   . Days of Exercise per Week: Not on file  . Minutes of Exercise per Session: Not on file  Stress:   . Feeling of Stress : Not on file  Social Connections:   . Frequency of Communication with Friends and Family: Not on file  . Frequency of Social Gatherings with Friends and Family: Not on file  . Attends Religious Services: Not on file  . Active Member of Clubs or Organizations: Not on file  . Attends Programme researcher, broadcasting/film/video Meetings: Not on file  . Marital Status: Not on file  Intimate Partner Violence:   . Fear of Current or Ex-Partner: Not on file  .  Emotionally Abused: Not on file  . Physically Abused: Not on file  . Sexually Abused: Not on file    Family History  Problem Relation Age of Onset  . Cancer Father   . Lung cancer Father   . Cancer Mother        67  . Breast cancer Mother 82    The following portions of the patient's history were reviewed and updated as appropriate: allergies, current medications, past family history, past medical history, past social history, past surgical history and problem list.  Review of Systems Pertinent items are noted in HPI.   Objective:  BP 119/71   Ht 5' 6.25" (1.683 m)   Wt 174 lb (78.9 kg)   BMI 27.87 kg/m  CONSTITUTIONAL: Well-developed, well-nourished female in no acute distress.  HENT:  Normocephalic, atraumatic, External right and left ear normal. Oropharynx is clear and moist EYES: Conjunctivae and EOM are normal. Pupils are equal, round, and reactive to light. No  scleral icterus.  NECK: Normal range of motion, supple, no masses.  Normal thyroid.  SKIN: Skin is warm and dry. No rash noted. Not diaphoretic. No erythema. No pallor. NEUROLOGIC: Alert and oriented to person, place, and time. Normal reflexes, muscle tone coordination. No cranial nerve deficit noted. PSYCHIATRIC: Normal mood and affect. Normal behavior. Normal judgment and thought content. CARDIOVASCULAR: Normal heart rate noted RESPIRATORY: Effort normal, no problems with respiration noted. BREASTS: Symmetric in size. No masses, skin changes, nipple drainage, or lymphadenopathy. ABDOMEN: Soft, no distention noted.  No tenderness, rebound or guarding.  PELVIC: Normal appearing external genitalia; normal appearing vaginal mucosa and cervix. IUD strings protruding from os. IUD removed, see note.  No abnormal discharge noted.  Pap smear obtained. Pelvic cultures obtained. Normal uterine size, no other palpable masses, no uterine or adnexal tenderness. MUSCULOSKELETAL: Normal range of motion. No tenderness.  No cyanosis, clubbing, or edema.  2+ distal pulses.  Exam done with chaperone present.  Assessment and Plan:   1. Well woman exam with routine gynecological exam Healthy female exam - Cytology - PAP( [place])  2. Screening for STD (sexually transmitted disease) - Cytology - PAP( [place])  3. Pelvic pain Would like IUD removed  4. Encounter for IUD removal See note  5. Encounter for counseling regarding contraception Reviewed options for contraception, pt would like patch, would not reocmmend given age and tobacco use - reviewed options including permanent sterilization, progesterone containing contraception, she would like permanent sterilization - reviewed permanent sterilization, surgery, risks/benefits and she would like to proceed - removed IUD today, she verbalized understanding of ability to get pregnant immediately, see note  6. Counseled about COVID-19 virus  infection COVID-19 Vaccine Counseling: The patient was counseled on the potential benefits and lack of known risks of COVID vaccination, during today's visit. The patient's questions and concerns were addressed today, including safety of the vaccination and potential side effects as they have been published by ACOG and [facility]. All patient questions were addressed during our visit today. The patient is not planning to get vaccinated at this time.    Will follow up results of pap smear/STI screen and manage accordingly. Encouraged improvement in diet and exercise.  Accepts STI screen. COVID vaccine declines Mammogram na/ Referral for colonoscopy n/a Flu vaccine declines  Routine preventative health maintenance measures emphasized. Please refer to After Visit Summary for other counseling recommendations.   Total face-to-face time with patient: 35 minutes. Over 50% of encounter was spent on counseling and coordination of  care.   Feliz Beam, M.D. Attending Center for [employer] Fish farm manager)

## 2020-01-17 LAB — CYTOLOGY - PAP
Chlamydia: NEGATIVE
Comment: NEGATIVE
Comment: NEGATIVE
Comment: NORMAL
Diagnosis: NEGATIVE
High risk HPV: NEGATIVE
Neisseria Gonorrhea: NEGATIVE

## 2020-01-21 ENCOUNTER — Telehealth: Payer: Self-pay

## 2020-01-21 NOTE — Telephone Encounter (Signed)
Attempted to reach patient by phone.  Sent my chart message with need to come sign medicaid tubal consent form. Armandina Stammer RN

## 2020-01-26 NOTE — Telephone Encounter (Signed)
Left message for patient to return call to office. Shalae Belmonte  RN 

## 2020-02-08 ENCOUNTER — Telehealth: Payer: Self-pay

## 2020-02-08 NOTE — Telephone Encounter (Signed)
Called patient to let her know her surgery for 12/28 has been cancelled, she did not go by the office to sign 30 day medicaid papers. No answer, mailbox is full, unable to leave message, my chart message sent

## 2020-02-21 ENCOUNTER — Ambulatory Visit: Payer: Medicaid Other | Admitting: Obstetrics and Gynecology

## 2020-03-07 ENCOUNTER — Encounter (HOSPITAL_BASED_OUTPATIENT_CLINIC_OR_DEPARTMENT_OTHER): Payer: Self-pay

## 2020-03-07 ENCOUNTER — Ambulatory Visit (HOSPITAL_BASED_OUTPATIENT_CLINIC_OR_DEPARTMENT_OTHER): Admit: 2020-03-07 | Payer: Medicaid Other | Admitting: Obstetrics and Gynecology

## 2020-03-07 SURGERY — SALPINGECTOMY, BILATERAL, LAPAROSCOPIC
Anesthesia: Choice | Laterality: Bilateral

## 2020-03-15 ENCOUNTER — Ambulatory Visit: Payer: Medicaid Other | Admitting: Obstetrics & Gynecology
